# Patient Record
Sex: Male | Born: 1945 | Race: White | Hispanic: No | Marital: Married | Smoking: Former smoker
Health system: Southern US, Community
[De-identification: ages and names within clinical notes are randomized; demographics above are authoritative.]

## PROBLEM LIST (undated history)

## (undated) DIAGNOSIS — I10 Essential (primary) hypertension: Secondary | ICD-10-CM

## (undated) DIAGNOSIS — C61 Malignant neoplasm of prostate: Secondary | ICD-10-CM

## (undated) DIAGNOSIS — K219 Gastro-esophageal reflux disease without esophagitis: Secondary | ICD-10-CM

## (undated) HISTORY — DX: Malignant neoplasm of prostate: C61

## (undated) HISTORY — PX: KNEE ARTHROSCOPY: SUR90

## (undated) HISTORY — PX: TONSILLECTOMY: SUR1361

## (undated) HISTORY — DX: Essential (primary) hypertension: I10

## (undated) HISTORY — PX: INGUINAL HERNIA REPAIR: SUR1180

## (undated) HISTORY — PX: TOTAL KNEE ARTHROPLASTY: SHX125

## (undated) HISTORY — DX: Gastro-esophageal reflux disease without esophagitis: K21.9

---

## 2006-05-17 ENCOUNTER — Encounter (INDEPENDENT_AMBULATORY_CARE_PROVIDER_SITE_OTHER): Payer: Self-pay | Admitting: Specialist

## 2006-05-17 ENCOUNTER — Inpatient Hospital Stay (HOSPITAL_COMMUNITY): Admission: RE | Admit: 2006-05-17 | Discharge: 2006-05-18 | Payer: Self-pay | Admitting: Urology

## 2006-09-30 ENCOUNTER — Emergency Department (HOSPITAL_COMMUNITY): Admission: EM | Admit: 2006-09-30 | Discharge: 2006-09-30 | Payer: Self-pay | Admitting: Emergency Medicine

## 2009-10-12 ENCOUNTER — Ambulatory Visit (HOSPITAL_BASED_OUTPATIENT_CLINIC_OR_DEPARTMENT_OTHER): Admission: RE | Admit: 2009-10-12 | Discharge: 2009-10-12 | Payer: Self-pay | Admitting: Urology

## 2010-09-28 LAB — POCT I-STAT 4, (NA,K, GLUC, HGB,HCT): Hemoglobin: 13.6 g/dL (ref 13.0–17.0)

## 2010-11-25 NOTE — Discharge Summary (Signed)
NAME:  Jonathan Jensen, Jonathan Jensen NO.:  [id number]   MEDICAL RECORD NO.:  [id number]          PATIENT TYPE:  INP   LOCATION:  1402                         FACILITY:  [facility]   PHYSICIAN:  Heloise Purpura, MD      DATE OF BIRTH:  [birth year]   DATE OF ADMISSION:  05/17/2006  DATE OF DISCHARGE:  05/18/2006                                 DISCHARGE SUMMARY   ADMISSION DIAGNOSIS:  Prostate cancer.   DISCHARGE DIAGNOSIS:  Prostate cancer.   HISTORY AND PHYSICAL:  For full details, please see admission history and  physical.  Briefly, Jonathan Jensen is a 65 year old gentleman with clinical  stage T1-C prostate cancer with a PSA of 4.7 and a Gleason score of 3+3=6.  After discussing management options for clinically localized prostate  cancer, the patient elected to proceed with surgical therapy with a robotic-  assisted laparoscopic approach.   HOSPITAL COURSE:  The patient was taken to the operating room on May 17, 2006, and underwent a robotic-assisted laparoscopic radical prostatectomy.  He tolerated this procedure well and without complications.  Postoperatively, he was able to be transferred to a regular hospital room  following recovery from anesthesia.  He was able to begin ambulating the  night of surgery and by postoperative day #1 was ambulating well and was  able to begin a clear liquid diet.  He tolerated this well and was able to  be transitioned to oral pain medication.  He maintained excellent urine  output with minimal output with from his pelvic drain, and his pelvic drain  was able to be removed on the afternoon of postoperative day #1.  He had met  all discharge criteria and was therefore discharged home that day.   DISPOSITION:  Home.   DISCHARGE MEDICATIONS:  The patient was instructed to resume his regular  home medications excepting any aspirin, nonsteroidal anti-inflammatory  drugs, or aspirin or herbal supplements.  He was given a prescription to  take Vicodin as needed for pain and to use Colace as a stool softener.  He  was also instructed to take Cipro beginning 1 day prior to his return visit  for Foley catheter removal.   DISCHARGE INSTRUCTIONS:  The patient was instructed to be ambulatory but  specifically instructed to refrain from any heavy lifting, strenuous  activity, or driving.  He was given instructions on routine Foley catheter  care and given a leg bag for daytime usage.   SURGICAL PATHOLOGY:  The preliminary result from the patient's surgical  pathology demonstrated a PT2-C, Nx, Mx, Gleason 3+3=6 adenocarcinoma with  cancer extending close to the margin at the left apex.  There was no  definite evidence of extraprostatic extension.   FOLLOW-UP:  Jonathan Jensen will follow up in 1 week for removal of his Foley  catheter.           ______________________________  Heloise Purpura, MD  Electronically Signed     LB/MEDQ  D:  05/18/2006  T:  05/19/2006  Job:  [id number]   cc:   Glennie Hawk, M.D.  [place], [place]

## 2010-11-25 NOTE — H&P (Signed)
NAME:  KYM, FENTER NO.:  [id number]   MEDICAL RECORD NO.:  [id number]          PATIENT TYPE:  INP   LOCATION:  NA                           FACILITY:  [facility]   PHYSICIAN:  Heloise Purpura, MD      DATE OF BIRTH:  [birth year]   DATE OF ADMISSION:  DATE OF DISCHARGE:                                HISTORY & PHYSICAL   CHIEF COMPLAINT:  Prostate cancer.   HISTORY:  Mr. Hinderliter is a 65 year old gentleman with clinical stage T1c  prostate cancer with a PSA of 4.7 and Gleason score of 3 + 3 = 6  adenocarcinoma.  After discussion regarding management options for low-risk  clinically localized prostate cancer, the patient elected to proceed with  surgical therapy and a robotic-assisted laparoscopic approach.   PAST MEDICAL HISTORY:  1. Hypertension.  2. History of GI bleed secondary to peptic ulcer disease.  3. Gastroesophageal reflux disease.  4. History of horseshoe kidneys.   PAST SURGICAL HISTORY:  1. Tonsillectomy.  2. Arthroscopic knee surgery.   MEDICATIONS:  1. Prevacid.  2. Lisinopril.  3. Multivitamin.  4. Vitamin B complex  5. Flaxseed oil.  6. Selenium.  7. Ibuprofen  8. Aspirin.   ALLERGIES:  NO KNOWN DRUG ALLERGIES.   FAMILY HISTORY:  Positive for COPD and hypertension.  There is no history of  GU malignancy or prostate cancer.   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  He is a retired Transport planner  for [employer].  He is married.  He did smoke 1 pack of cigarettes for 20  years, but quit in 1987.  He drinks alcohol only very occasionally.   PHYSICAL EXAM:  CONSTITUTIONAL:  Well-nourished, well-developed age-  appropriate male in no acute distress.  CARDIOVASCULAR:  Regular rate and rhythm without obvious murmurs.  LUNGS:  Clear bilaterally.  ABDOMEN:  Soft, nontender, nondistended without abdominal masses.  DRE:  No prostate nodularity or induration.   IMPRESSION:  Clinically localized prostate cancer.   PLAN:  Mr. Mccaskey  will undergo a robotic-assisted laparoscopic radical  prostatectomy and then be admitted to the hospital for routine postoperative  care.           ______________________________  Heloise Purpura, MD  Electronically Signed     LB/MEDQ  D:  05/16/2006  T:  05/17/2006  Job:  [id number]

## 2010-11-25 NOTE — Op Note (Signed)
NAME:  Jonathan Jensen, Jonathan Jensen NO.:  [id number]   MEDICAL RECORD NO.:  [id number]          PATIENT TYPE:  INP   LOCATION:  0002                         FACILITY:  [facility]   PHYSICIAN:  Heloise Purpura, MD      DATE OF BIRTH:  1946-01-28   DATE OF PROCEDURE:  05/17/2006  DATE OF DISCHARGE:                                 OPERATIVE REPORT   PREOPERATIVE DIAGNOSIS:  Clinically localized adenocarcinoma of the  prostate.   POSTOPERATIVE DIAGNOSIS:  Clinically localized adenocarcinoma of the  prostate.   PROCEDURES.:  Robotic assisted laparoscopic radical prostatectomy (bilateral  nerve sparing).   SURGEON:  Heloise Purpura, M.D.   ASSISTANT:  Excell Seltzer. Annabell Howells, M.D.   ANESTHESIA:  General.   COMPLICATIONS:  None.   ESTIMATED BLOOD LOSS:  225 mL.   INTRAVENOUS FLUIDS:  2 liters of lactated Ringer's.   SPECIMEN:  Prostate and seminal vesicles.   DRAINS:  1. 20-French Coude catheter.  2. 19 Blake pelvic drain.   INDICATIONS:  Mr. Jonathan Jensen is a 65 year old gentleman with clinical stage  T1C prostate cancer with a PSA of 4.7 and Gleason score of 3+3=6  adenocarcinoma.  His pretreatment IPSS score was 9 with an IIEF score of 24.  After discussing management options for clinically localized prostate  cancer, the patient elected to proceed with the above procedure.  The  potential risks and benefits were discussed with the patient and he  consented.   DESCRIPTION OF PROCEDURE:  The patient was taken to the operating room and a  general anesthetic was administered.  He was given preoperative antibiotics,  placed in the dorsal lithotomy position, prepped and draped in the usual  sterile fashion.  Next, a preoperative time-out was performed.  A Foley  catheter was then inserted into the bladder.  A site was selected 18 cm from  the pubic symphysis and just to the left of the umbilicus for placement of  the camera port.  This was placed using a standard open Hassan technique.  This allowed entry into the peritoneal cavity under direct vision.  A 12 mm  port was then placed and a 0 degree lens was used to inspect the abdomen  after a pneumoperitoneum was created.  There was no evidence of any intra-  abdominal injuries or other abnormalities.  The remaining ports were then  placed.  Bilateral 8 mm robotic ports were placed 16 cm from the pubic  symphysis and 10 cm lateral to the camera port.  An additional 8 mm robotic  port was placed in the far left lateral abdominal wall.  A 5 mm port was  placed between the camera port and the right robotic port.  An additional 12  mm port was placed in the far right lateral abdominal wall.  All  laparoscopic ports were placed under direct vision without difficulty.  The  surgical cart was then docked.   With the aid of the cautery scissors, the bladder was reflected posteriorly  and the space of Retzius was entered.  The endopelvic fascia and prostate  were identified.  The endopelvic fascia was then incised from  the apex back  to the base of the prostate bilaterally and the underlying levator muscle  fibers were swept laterally off the prostate, thereby, isolating the dorsal  venous complex of the prostate.  The dorsal venous complex was then stapled  and divided with a 45 mm flex ETS stapler.  The bladder neck was identified  with the aid of Foley catheter manipulation and entered anteriorly.  This  exposed the Foley catheter.  The catheter balloon was deflated and the  catheter was brought into the operative field and used to retract the  prostate anteriorly.  This exposed the posterior bladder neck which was then  divided and dissection continued between the bladder neck and prostate until  the vasa deferentia and seminal vesicles were identified.  The vasa  deferentia were isolated, divided and lifted anteriorly.  The seminal  vesicles were also dissected free with care to control the seminal vesicle  arterial blood  supply.  These structures were then lifted anteriorly, as  well.  The space between Denonvilliers' fascia and the anterior rectum was  then bluntly developed, thereby, isolating the vascular pedicles of the  prostate.  The lateral prostatic fascia was then incised bilaterally  allowing the neurovascular bundles to be swept laterally and posteriorly off  the prostate.  The vascular pedicles of the prostate were then ligated above  the level of the neurovascular bundles with Hem-A-Lock clips and divided  with sharp cold scissor dissection.  The neurovascular bundles were swept  off the apex of the prostate and off the urethra.  The urethra was then  sharply divided and the prostate specimen was placed up into the abdomen for  later removal.  There was no evidence of a rectal injury.  The pelvis was  copiously irrigated and hemostasis was ensured.   Attention was then turned to the urethral anastomosis.  A 2-0 Vicryl slip-  knot was placed at the 6 o'clock position between the bladder neck and  urethra to reapproximate these structures.  A double-armed 3-0 Monocryl  suture was then used to perform a 360 degrees running tension-free  anastomosis between the bladder neck and urethra.  A new 20-French Coude  catheter was inserted into the bladder and irrigated.  There were no blood  clots within the bladder and the anastomosis appeared watertight.  A #19  Blake drain was then brought through the left robotic port and appropriately  positioned in the pelvis.  It was secured to the skin with a nylon suture.  The surgical cart was undocked.  The prostate specimen was placed into the  Endopouch retrieval bag via the periumbilical port site.  A 0 Vicryl suture  was then used to close the abdominal wall fascia of the right lateral 12 mm  port site with the suture passer device for port site closure.  All remaining ports were then removed under direct vision.  The prostate  specimen was then removed  intact within the Endopouch retrieval bag via the  periumbilical port site.  This fascial opening was then closed with a  running 0 Vicryl suture.  All port sites were then were injected with 0.25%  Marcaine and reapproximated at the skin level with staples.  Sterile  dressings were applied.  The patient appeared to tolerate the procedure well  without complications.  He was able to be extubated and transferred to the  recovery unit in satisfactory condition.           ______________________________  Heloise Purpura,  MD  Electronically Signed     LB/MEDQ  D:  05/17/2006  T:  05/17/2006  Job:  [id number]

## 2017-04-19 ENCOUNTER — Encounter: Payer: Self-pay | Admitting: Gastroenterology

## 2017-06-25 ENCOUNTER — Ambulatory Visit: Payer: Medicare Other | Admitting: Gastroenterology

## 2017-06-25 ENCOUNTER — Encounter: Payer: Self-pay | Admitting: Gastroenterology

## 2017-06-25 ENCOUNTER — Other Ambulatory Visit (INDEPENDENT_AMBULATORY_CARE_PROVIDER_SITE_OTHER): Payer: Medicare Other

## 2017-06-25 VITALS — BP 162/80 | HR 56 | Ht 71.25 in | Wt 268.5 lb

## 2017-06-25 DIAGNOSIS — R197 Diarrhea, unspecified: Secondary | ICD-10-CM | POA: Diagnosis not present

## 2017-06-25 DIAGNOSIS — R1013 Epigastric pain: Secondary | ICD-10-CM

## 2017-06-25 DIAGNOSIS — R1319 Other dysphagia: Secondary | ICD-10-CM

## 2017-06-25 LAB — COMPREHENSIVE METABOLIC PANEL
ALK PHOS: 55 U/L (ref 39–117)
ALT: 19 U/L (ref 0–53)
AST: 17 U/L (ref 0–37)
Albumin: 4.1 g/dL (ref 3.5–5.2)
BILIRUBIN TOTAL: 0.5 mg/dL (ref 0.2–1.2)
BUN: 19 mg/dL (ref 6–23)
CALCIUM: 9.3 mg/dL (ref 8.4–10.5)
CO2: 27 mEq/L (ref 19–32)
Chloride: 103 mEq/L (ref 96–112)
Creatinine, Ser: 1.08 mg/dL (ref 0.40–1.50)
GFR: 71.56 mL/min (ref >60.00)
Glucose, Bld: 97 mg/dL (ref 70–99)
POTASSIUM: 5.1 meq/L (ref 3.5–5.1)
Sodium: 136 mEq/L (ref 135–145)
TOTAL PROTEIN: 7.4 g/dL (ref 6.0–8.3)

## 2017-06-25 LAB — LIPASE: Lipase: 14 U/L (ref 11.0–59.0)

## 2017-06-25 NOTE — Progress Notes (Signed)
Jonathan Jensen    [id number]    [birth year]  Primary Care Physician:Settle, Hall Busing, MD  Referring Physician: No referring provider defined for this encounter.  Chief complaint:  Abdominal pain, Diarrhea, GERD  HPI: 28 yr F M here to establish care. He was seeing Dr Posey Pronto in [place].  Epigastric abdominal pain worse in past 6 months, he thinks it is related to his blood pressure as he had blood pressure issues, had medication changes. Yesterday vision was blurred, BP was high 210/105 He is having sinus infection for the past 3 weeks, was taking pseudophed, flu/mucus Played golf last week, was feeling well but was constipated at the time and has diarrhea this week with change in bowel consistency.  On most days he has 1-2 bowel movements a day Review of systems positive for intermittent dysphagia mostly with dry meats and solids. Denies any vomiting, melena or blood per rectum.  No weight loss No family history of colon cancer He takes NSAIDs intermittently He drinks 1-2 hard liquor daily or wine   Colonoscopy May 05, 2014 Diverticulosis, 2-3 mm colon polyp from the descending colon, internal hemorrhoids EGD 2010 with endoscopic suspicion for Barrett's esophagus but pathology was negative with no evidence of intestinal metaplasia EGD May 25, 2014 showed gastritis and possible short segment of Barrett's esophagus    Outpatient Encounter Medications as of 06/25/2017  Medication Sig  . aspirin EC 81 MG tablet Take 81 mg by mouth daily.  Marland Kitchen atenolol (TENORMIN) 100 MG tablet Take 100 mg by mouth daily.  . Cholecalciferol (VITAMIN D3) 5000 units CAPS Take 1 capsule by mouth daily.  . Coenzyme Q10 (CO Q-10) 200 MG CAPS Take 1 capsule by mouth daily.  Marland Kitchen ibuprofen (ADVIL,MOTRIN) 200 MG tablet Take by mouth. Take 4 tablets by mouth in the morning and 3 tabs at night  . lisinopril (PRINIVIL,ZESTRIL) 10 MG tablet Take 10 mg by mouth daily.  Marland Kitchen MAGNESIUM PO Take 2  tablets by mouth 2 (two) times daily.  . Melatonin 10 MG TABS Take 1 tablet by mouth at bedtime.  . NON FORMULARY Catalyn GF 3 capsules daily  . NON FORMULARY Drenamin 3 tablets daily  . NON FORMULARY Cataples B-GF 2 tablets daily  . NON FORMULARY Cataplex E2 3 tablets daily  . Omega-3 Krill Oil 500 MG CAPS Take 1 capsule by mouth daily.  Marland Kitchen omeprazole (PRILOSEC) 40 MG capsule Take 40 mg by mouth daily.  . Potassium 99 MG TABS Take 1 tablet by mouth daily.  . Red Yeast Rice 600 MG CAPS Take 1 capsule by mouth daily.  . Testosterone Micronized POWD Take 2 tablets by mouth daily.   No facility-administered encounter medications on file as of 06/25/2017.     Allergies as of 06/25/2017  . (No Known Allergies)    Past Medical History:  Diagnosis Date  . GERD (gastroesophageal reflux disease)   . Hypertension   . Prostate CA [facility])     Past Surgical History:  Procedure Laterality Date  . INGUINAL HERNIA REPAIR Left   . KNEE ARTHROSCOPY Bilateral   . TONSILLECTOMY    . TOTAL KNEE ARTHROPLASTY Bilateral     No family history on file.  Social History   Socioeconomic History  . Marital status: Married    Spouse name: Not on file  . Number of children: 3  . Years of education: Not on file  . Highest education level: Not on file  Social Needs  . Financial resource strain: Not on file  . Food insecurity - worry: Not on file  . Food insecurity - inability: Not on file  . Transportation needs - medical: Not on file  . Transportation needs - non-medical: Not on file  Occupational History  . Occupation: retired  Tobacco Use  . Smoking status: Former Smoker    Types: Cigars, Cigarettes  . Smokeless tobacco: Never Used  . Tobacco comment: smokes some Cigars now  Substance and Sexual Activity  . Alcohol use: Yes    Comment: 1 per day  . Drug use: No  . Sexual activity: Not on file  Other Topics Concern  . Not on file  Social History Narrative  . Not on file       Review of systems: Review of Systems  Constitutional: Negative for fever and chills. Positive for fatigue HENT: Positive for sinus trouble  Eyes: Negative for blurred vision.  Respiratory: Negative for cough, shortness of breath and wheezing.   Cardiovascular: Negative for chest pain and palpitations.  Gastrointestinal: as per HPI Genitourinary: Negative for dysuria and hematuria. Positive for urinary urgency and urinary incontinence Musculoskeletal: Positive for myalgias, back pain and joint pain.  Skin: Negative for itching and rash.  Neurological: Negative for dizziness, tremors, focal weakness, seizures and loss of consciousness.  Endo/Heme/Allergies: Positive for seasonal allergies.  Psychiatric/Behavioral: Negative for depression, suicidal ideas and hallucinations.  All other systems reviewed and are negative.   Physical Exam: Vitals:   06/25/17 1019  BP: (!) 162/80  Pulse: (!) 56   Body mass index is 37.19 kg/m. Gen:      No acute distress HEENT:  EOMI, sclera anicteric Neck:     No masses; no thyromegaly Lungs:    Clear to auscultation bilaterally; normal respiratory effort CV:         Regular rate and rhythm; no murmurs Abd:      + bowel sounds; soft, non-tender; no palpable masses, no distension Ext:    No edema; adequate peripheral perfusion Skin:      Warm and dry; no rash Neuro: alert and oriented x 3 Psych: normal mood and affect  Data Reviewed:  Reviewed labs, radiology imaging, old records and pertinent past GI work up   Assessment and Plan/Recommendations:  71 year old male with complaints of epigastric abdominal pain, intermittent solid dysphagia and change in bowel consistency with intermittent diarrhea Scheduled for EGD to evaluate, exclude peptic ulcer disease, gastritis and also for possible dilation The risks and benefits as well as alternatives of endoscopic procedure(s) have been discussed and reviewed. All questions answered. The  patient agrees to proceed.  Abdominal ultrasound to exclude gallstones or gallbladder disease  Follow-up CMP and lipase  We will do a trial of Creon for 2 days see if he has pancreatic insufficiency leading to his symptoms    K. Denzil Magnuson , MD [phone] Mon-Fri 8a-5p [phone] after 5p, weekends, holidays  CC: No ref. provider found

## 2017-06-25 NOTE — Patient Instructions (Addendum)
You have been scheduled for an abdominal ultrasound at [facility] Radiology (1st floor of hospital) on 07/05/2017 at 9am. Please arrive 15 minutes prior to your appointment for registration. Make certain not to have anything to eat or drink 8 hours prior to your appointment. Should you need to reschedule your appointment, please contact radiology at [phone]. This test typically takes about 30 minutes to perform.   You have been scheduled for an endoscopy. Please follow written instructions given to you at your visit today. If you use inhalers (even only as needed), please bring them with you on the day of your procedure.   Go to the basement today for labs   We have given you a two day trial of creon to take  Take 2 capsules three times a day with your meals and a snack  Samples given then discontinue

## 2017-07-05 ENCOUNTER — Other Ambulatory Visit: Payer: Medicare Other

## 2017-07-05 ENCOUNTER — Ambulatory Visit (HOSPITAL_COMMUNITY)
Admission: RE | Admit: 2017-07-05 | Discharge: 2017-07-05 | Disposition: A | Discharge status: Home / Self Care | Payer: Medicare Other | Source: Ambulatory Visit | Attending: Gastroenterology | Admitting: Gastroenterology

## 2017-07-05 DIAGNOSIS — Q631 Lobulated, fused and horseshoe kidney: Secondary | ICD-10-CM | POA: Insufficient documentation

## 2017-07-05 DIAGNOSIS — R1013 Epigastric pain: Secondary | ICD-10-CM | POA: Diagnosis not present

## 2017-07-05 DIAGNOSIS — R1319 Other dysphagia: Secondary | ICD-10-CM | POA: Diagnosis not present

## 2017-07-05 DIAGNOSIS — R932 Abnormal findings on diagnostic imaging of liver and biliary tract: Secondary | ICD-10-CM | POA: Insufficient documentation

## 2017-07-05 DIAGNOSIS — R197 Diarrhea, unspecified: Secondary | ICD-10-CM | POA: Diagnosis not present

## 2017-07-06 LAB — FECAL LACTOFERRIN, QUANT
FECAL LACTOFERRIN: POSITIVE — AB
MICRO NUMBER: 81452489
SPECIMEN QUALITY: ADEQUATE

## 2017-07-11 ENCOUNTER — Encounter: Payer: Self-pay | Admitting: Gastroenterology

## 2017-07-19 ENCOUNTER — Other Ambulatory Visit: Payer: Self-pay

## 2017-07-19 ENCOUNTER — Ambulatory Visit (AMBULATORY_SURGERY_CENTER): Payer: Medicare Other | Admitting: Gastroenterology

## 2017-07-19 ENCOUNTER — Encounter: Payer: Self-pay | Admitting: Gastroenterology

## 2017-07-19 VITALS — BP 121/71 | HR 48 | Temp 98.7°F | Resp 22 | Ht 71.0 in | Wt 268.0 lb

## 2017-07-19 DIAGNOSIS — R1310 Dysphagia, unspecified: Secondary | ICD-10-CM | POA: Diagnosis not present

## 2017-07-19 DIAGNOSIS — K222 Esophageal obstruction: Secondary | ICD-10-CM

## 2017-07-19 MED ORDER — OMEPRAZOLE 40 MG PO CPDR: 40.0000 mg | DELAYED_RELEASE_CAPSULE | Freq: Two times a day (BID) | ORAL | 11 refills | Status: DC

## 2017-07-19 MED ORDER — SODIUM CHLORIDE 0.9 % IV SOLN: 500.0000 mL | Freq: Once | INTRAVENOUS | Status: AC

## 2017-07-19 NOTE — Progress Notes (Signed)
Called to room to assist during endoscopic procedure.  Patient ID and intended procedure confirmed with present staff. Received instructions for my participation in the procedure from the performing physician.  

## 2017-07-19 NOTE — Progress Notes (Signed)
Report given to PACU, vss 

## 2017-07-19 NOTE — Op Note (Signed)
[facility] Patient Name: Jonathan Jensen Procedure Date: 07/19/2017 9:24 AM MRN: [id number] Endoscopist: Mauri Pole , MD Age: 72 Referring MD:  Date of Birth: 1946/01/19 Gender: Male Account #: [id number] Procedure:                Upper GI endoscopy Indications:              Dysphagia Medicines:                Monitored Anesthesia Care Procedure:                Pre-Anesthesia Assessment:                           - Prior to the procedure, a History and Physical                            was performed, and patient medications and                            allergies were reviewed. The patient's tolerance of                            previous anesthesia was also reviewed. The risks                            and benefits of the procedure and the sedation                            options and risks were discussed with the patient.                            All questions were answered, and informed consent                            was obtained. Prior Anticoagulants: The patient has                            taken no previous anticoagulant or antiplatelet                            agents. ASA Grade Assessment: II - A patient with                            mild systemic disease. After reviewing the risks                            and benefits, the patient was deemed in                            satisfactory condition to undergo the procedure.                           After obtaining informed consent, the endoscope was  passed under direct vision. Throughout the                            procedure, the patient's blood pressure, pulse, and                            oxygen saturations were monitored continuously. The                            Endoscope was introduced through the mouth, and                            advanced to the second part of duodenum. The upper                            GI endoscopy was accomplished without  difficulty.                            The patient tolerated the procedure well. Scope In: Scope Out: Findings:                 One mild benign-appearing, intrinsic stenosis was                            found 39 to 40 cm from the incisors. This measured                            1.7 cm (inner diameter) x less than one cm (in                            length) and was traversed. A TTS dilator was passed                            through the scope. Dilation with an 18-19-20 mm                            balloon dilator was performed to 20 mm. The                            dilation site was examined following endoscope                            reinsertion and showed moderate improvement in                            luminal narrowing. Estimated blood loss was minimal.                           The stomach was normal.                           The examined duodenum was normal. Complications:            No immediate complications. Estimated Blood Loss:  Estimated blood loss was minimal. Impression:               - Benign-appearing esophageal stenosis. Dilated.                           - Normal stomach.                           - Normal examined duodenum.                           - No specimens collected. Recommendation:           - Patient has a contact number available for                            emergencies. The signs and symptoms of potential                            delayed complications were discussed with the                            patient. Return to normal activities tomorrow.                            Written discharge instructions were provided to the                            patient.                           - Full liquid diet today, then advance as tolerated                            to mechanical soft diet for 3 days.                           - Continue present medications.                           - Follow an antireflux regimen for the rest of the                             patient's life.                           - Use Prilosec (omeprazole) 40 mg PO BID for 1                            month followed by Omeprazole 40mg  daily, 30 mins                            before breakfast. Mauri Pole, MD 07/19/2017 10:24:56 AM This report has been signed electronically.

## 2017-07-19 NOTE — Patient Instructions (Signed)
Information given on post esophgeal dilation diet and gerdYOU HAD AN ENDOS[place]   Refer to the procedure report that was given to you for any specific questions about what was found during the examination.  If the procedure report does not answer your questions, please call your gastroenterologist to clarify.  If you requested that your care partner not be given the details of your procedure findings, then the procedure report has been included in a sealed envelope for you to review at your convenience later.  YOU SHOULD EXPECT: Some feelings of bloating in the abdomen. Passage of more gas than usual.  Walking can help get rid of the air that was put into your GI tract during the procedure and reduce the bloating. If you had a lower endoscopy (such as a colonoscopy or flexible sigmoidoscopy) you may notice spotting of blood in your stool or on the toilet paper. If you underwent a bowel prep for your procedure, you may not have a normal bowel movement for a few days.  Please Note:  You might notice some irritation and congestion in your nose or some drainage.  This is from the oxygen used during your procedure.  There is no need for concern and it should clear up in a day or so.  SYMPTOMS TO REPORT IMMEDIATELY:    Following upper endoscopy (EGD)  Vomiting of blood or coffee ground material  New chest pain or pain under the shoulder blades  Painful or persistently difficult swallowing  New shortness of breath  Fever of 100F or higher  Black, tarry-looking stools  For urgent or emergent issues, a gastroenterologist can be reached at any hour by calling [phone].   DIET:  We do recommend a small meal at first, but then you may proceed to your regular diet.  Drink plenty of fluids but you should avoid alcoholic beverages for 24 hours.  ACTIVITY:  You should plan to take it easy for the rest of today and you should NOT DRIVE or use heavy  machinery until tomorrow (because of the sedation medicines used during the test).    FOLLOW UP: Our staff will call the number listed on your records the next business day following your procedure to check on you and address any questions or concerns that you may have regarding the information given to you following your procedure. If we do not reach you, we will leave a message.  However, if you are feeling well and you are not experiencing any problems, there is no need to return our call.  We will assume that you have returned to your regular daily activities without incident.  If any biopsies were taken you will be contacted by phone or by letter within the next 1-3 weeks.  Please call us at [phone] if you have not heard about the biopsies in 3 weeks.    SIGNATURES/CONFIDENTIALITY: You and/or your care partner have signed paperwork which will be entered into your electronic medical record.  These signatures attest to the fact that that the information above on your After Visit Summary has been reviewed and is understood.  Full responsibility of the confidentiality of this discharge information lies with you and/or your care-partner.

## 2017-07-19 NOTE — Progress Notes (Signed)
Pt states no soy or egg allergy.

## 2017-07-20 ENCOUNTER — Telehealth: Payer: Self-pay | Admitting: *Deleted

## 2017-07-20 NOTE — Telephone Encounter (Signed)
  Follow up Call-  Call back number 07/19/2017  Post procedure Call Back phone  # [phone]  Permission to leave phone message Yes  Some recent data might be hidden     Patient questions:  Do you have a fever, pain , or abdominal swelling? No. Pain Score  0 *  Have you tolerated food without any problems? Yes.    Have you been able to return to your normal activities? Yes.    Do you have any questions about your discharge instructions: Diet   No. Medications  No. Follow up visit  No.  Do you have questions or concerns about your Care? No.  Actions: * If pain score is 4 or above: No action needed, pain <4.

## 2017-08-08 ENCOUNTER — Telehealth: Payer: Self-pay | Admitting: Gastroenterology

## 2017-08-08 MED ORDER — OMEPRAZOLE 40 MG PO CPDR: 40.0000 mg | DELAYED_RELEASE_CAPSULE | Freq: Every day | ORAL | 3 refills | Status: AC

## 2017-08-08 NOTE — Telephone Encounter (Signed)
Ok, he can continue Omeprazole once daily. Please change the Rx. Thank you

## 2017-08-08 NOTE — Telephone Encounter (Signed)
Script changed to once daily 40 mg

## 2017-08-08 NOTE — Telephone Encounter (Signed)
  Dr Silverio Decamp , [facility]  Patient took 40 mg for twice a day temporarily after he had his EGD, He picked up his new script and it still had twice a day he wants to be sure to only take it once a day now. I told him just to take the Omeprazole 40 mg once a day now since he completed the course of of twice a day

## 2018-07-05 ENCOUNTER — Other Ambulatory Visit: Payer: Self-pay

## 2018-07-11 ENCOUNTER — Other Ambulatory Visit: Payer: Self-pay

## 2019-02-19 IMAGING — US US ABDOMEN COMPLETE
1 series · 13 of 25 positions shown · non-contrast
Comparison: None.

CLINICAL DATA: 71-year-old male with a history of diarrhea and
abdominal pain

EXAM:
ABDOMEN ULTRASOUND COMPLETE

[Series 1: us abdomen complete · 0.22mm/px · 13 of 86 slices shown]
[im 1/86]
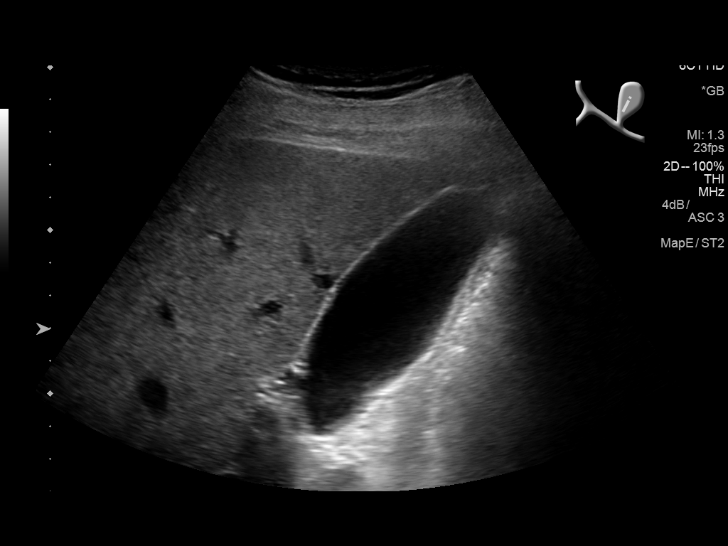
[im 8/86]
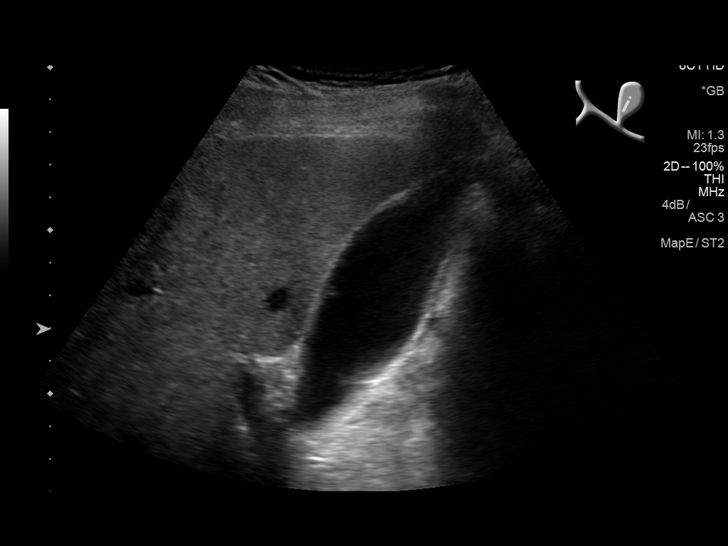
[im 15/86]
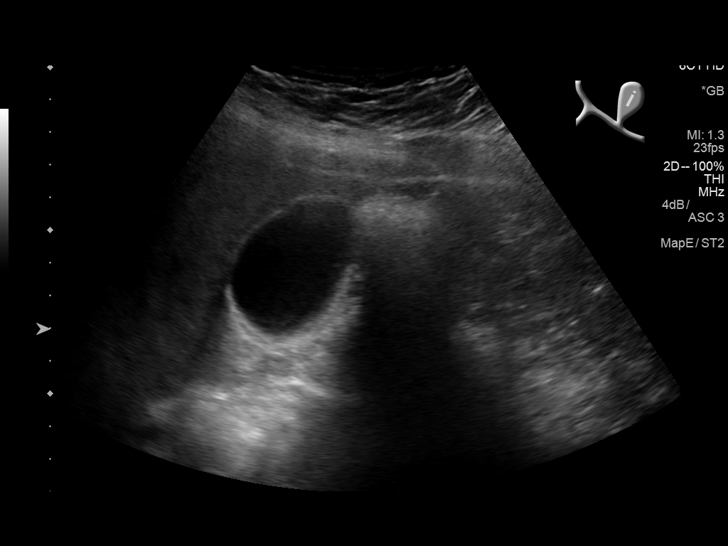
[im 22/86]
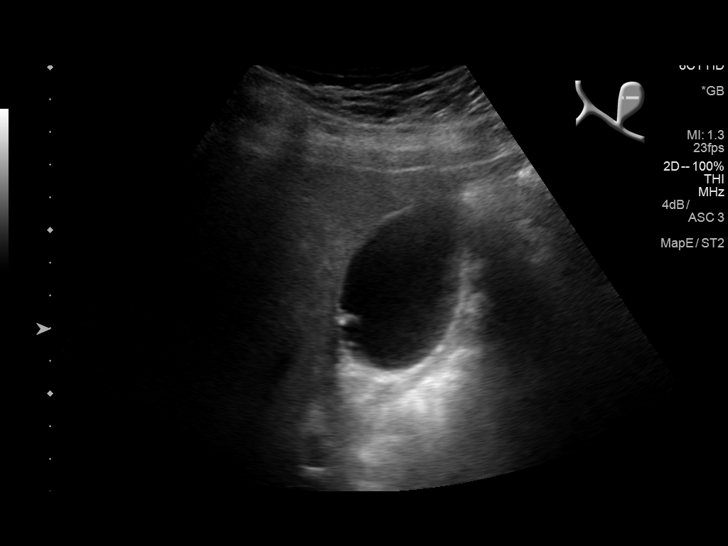
[im 29/86]
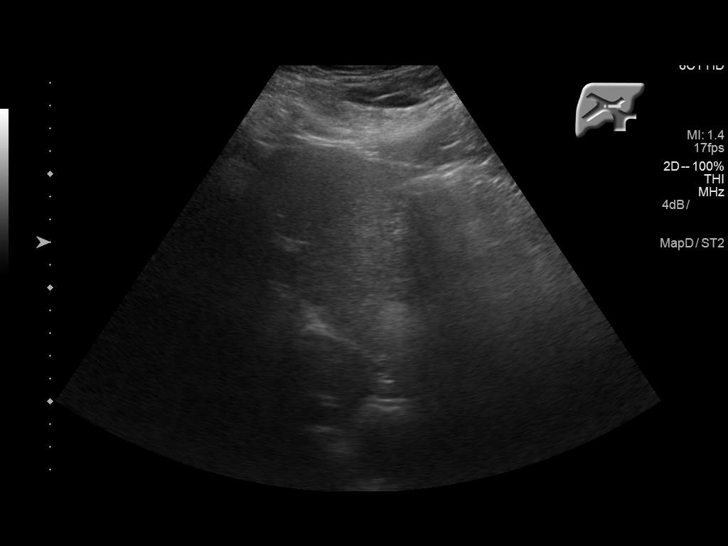
[im 36/86]
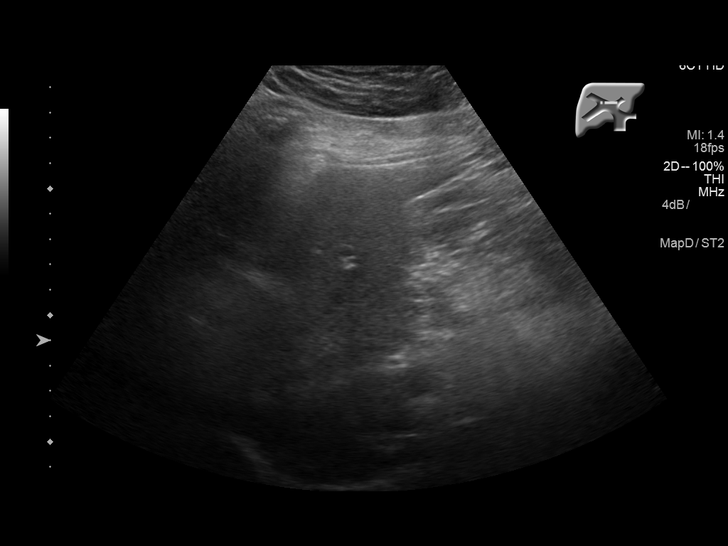
[im 43/86]
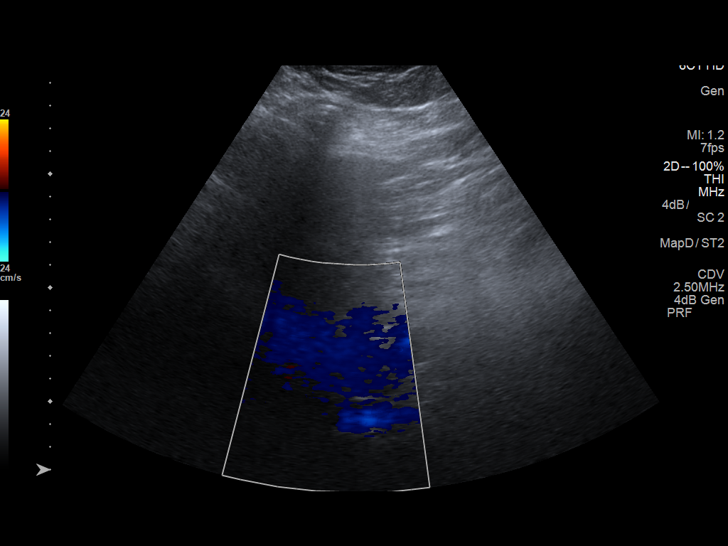
[im 50/86]
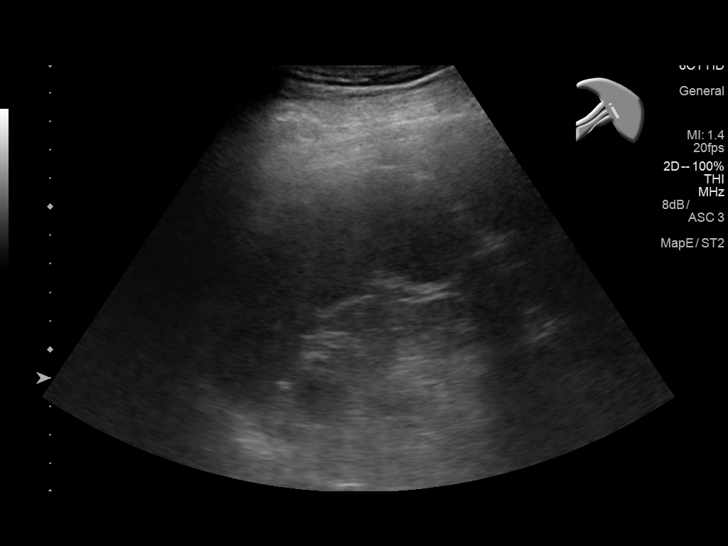
[im 57/86]
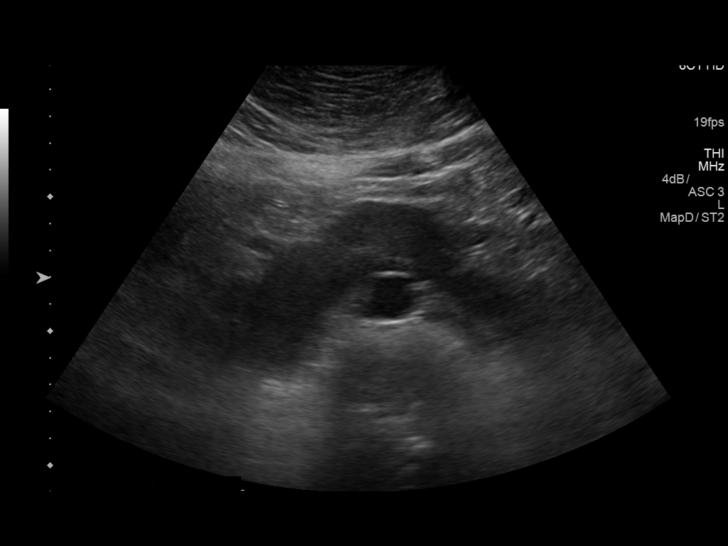
[im 64/86]
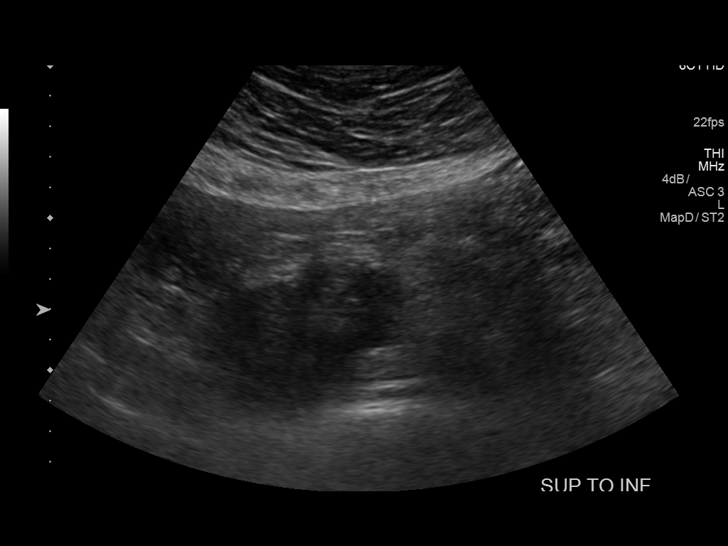
[im 71/86]
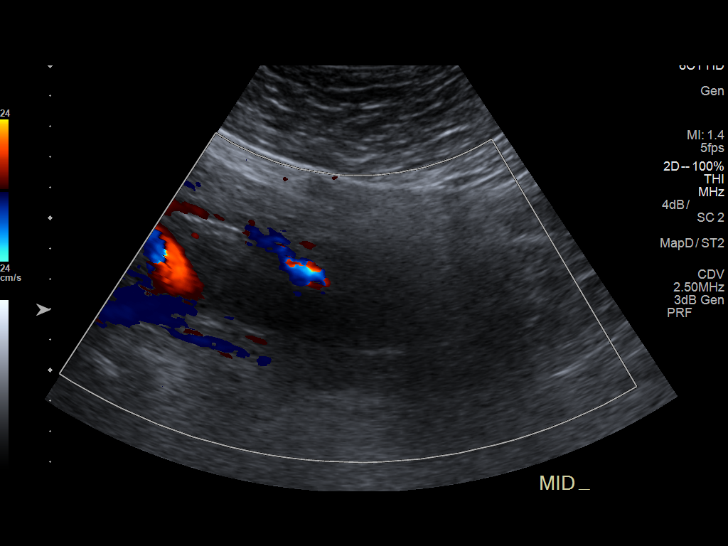
[im 78/86]
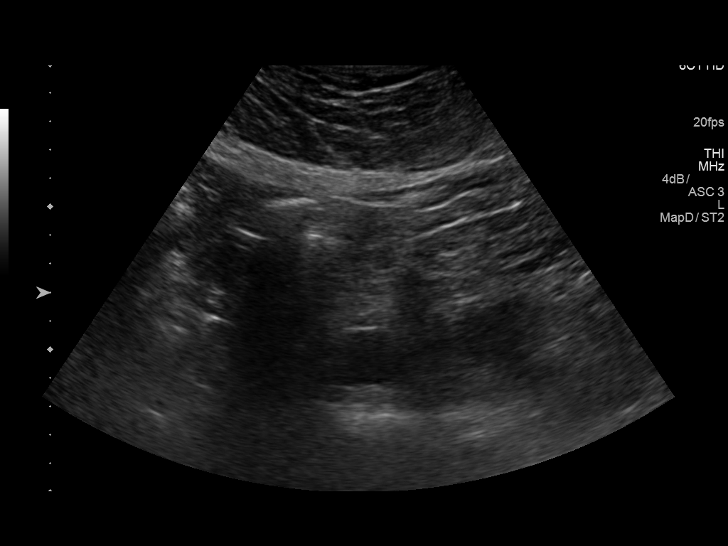
[im 86/86]
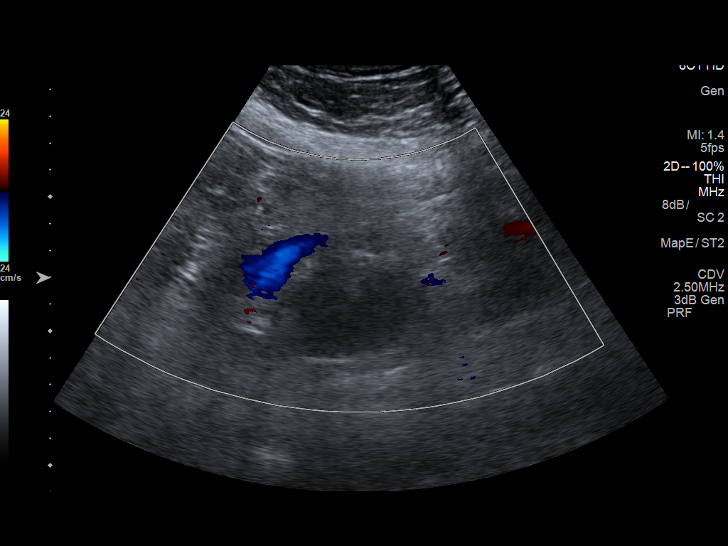

[13 of 25 positions shown; findings below may reference images not displayed]

FINDINGS: Gallbladder: No gallbladder wall thickening. No pericholecystic
fluid. Negative sonographic Murphy's sign. There are several
hyperechoic foci associated with the gallbladder wall, both
dependent and anti dependent. Largest measures 9 mm on the dependent
wall. No significant shadowing or ring down artifact.

Common bile duct: Diameter: 3 mm -4 mm

Liver: No focal lesion identified. Within normal limits in
parenchymal echogenicity.

IVC: No abnormality visualized.

Pancreas: Visualized portion unremarkable.

Spleen: Size and appearance within normal limits.

Kidneys:

Horseshoe kidney configuration identified. No evidence of
hydronephrosis of the left or right moiety. Flow confirmed within
the parenchyma of the left and right moiety.

Abdominal aorta: No aneurysm visualized.

Other findings: None.
IMPRESSION: Sonographic survey negative for acute cholecystitis.

There are several hyperechoic foci associated with the gallbladder
wall which may represent stones/ sludge or alternatively gallbladder
polyps. Given that the largest possible polyp measures, a follow-up
ultrasound in 12 months is recommended.

Ultrasound demonstrates horseshoe kidney without evidence of
hydronephrosis of the left or right moiety

## 2022-08-03 ENCOUNTER — Other Ambulatory Visit: Payer: Self-pay | Admitting: Nephrology

## 2022-08-03 DIAGNOSIS — G609 Hereditary and idiopathic neuropathy, unspecified: Secondary | ICD-10-CM

## 2022-08-03 DIAGNOSIS — N1832 Chronic kidney disease, stage 3b: Secondary | ICD-10-CM

## 2022-08-03 DIAGNOSIS — I129 Hypertensive chronic kidney disease with stage 1 through stage 4 chronic kidney disease, or unspecified chronic kidney disease: Secondary | ICD-10-CM

## 2022-08-03 DIAGNOSIS — E559 Vitamin D deficiency, unspecified: Secondary | ICD-10-CM

## 2022-08-23 ENCOUNTER — Ambulatory Visit
Admission: RE | Admit: 2022-08-23 | Discharge: 2022-08-23 | Disposition: A | Discharge status: Home / Self Care | Payer: Medicare Other | Source: Ambulatory Visit | Attending: Nephrology | Admitting: Nephrology

## 2022-08-23 DIAGNOSIS — E559 Vitamin D deficiency, unspecified: Secondary | ICD-10-CM

## 2022-08-23 DIAGNOSIS — G609 Hereditary and idiopathic neuropathy, unspecified: Secondary | ICD-10-CM

## 2022-08-23 DIAGNOSIS — N1832 Chronic kidney disease, stage 3b: Secondary | ICD-10-CM

## 2022-08-23 DIAGNOSIS — I129 Hypertensive chronic kidney disease with stage 1 through stage 4 chronic kidney disease, or unspecified chronic kidney disease: Secondary | ICD-10-CM

## 2023-02-23 ENCOUNTER — Other Ambulatory Visit: Payer: Self-pay | Admitting: Nephrology

## 2023-02-23 DIAGNOSIS — R93429 Abnormal radiologic findings on diagnostic imaging of unspecified kidney: Secondary | ICD-10-CM

## 2023-03-02 ENCOUNTER — Ambulatory Visit
Admission: RE | Admit: 2023-03-02 | Discharge: 2023-03-02 | Disposition: A | Discharge status: Home / Self Care | Payer: Medicare Other | Source: Ambulatory Visit | Attending: Nephrology | Admitting: Nephrology

## 2023-03-02 DIAGNOSIS — R93429 Abnormal radiologic findings on diagnostic imaging of unspecified kidney: Secondary | ICD-10-CM

## 2024-05-07 ENCOUNTER — Encounter: Payer: Self-pay | Admitting: Physician Assistant

## 2024-06-24 ENCOUNTER — Ambulatory Visit: Admitting: Physician Assistant
